# Patient Record
Sex: Female | Born: 1980 | Race: Black or African American | Hispanic: No | Marital: Single | Smoking: Current every day smoker
Health system: Southern US, Community
[De-identification: ages and names within clinical notes are randomized; demographics above are authoritative.]

## PROBLEM LIST (undated history)

## (undated) DIAGNOSIS — R011 Cardiac murmur, unspecified: Secondary | ICD-10-CM

## (undated) HISTORY — DX: Cardiac murmur, unspecified: R01.1

---

## 2005-01-07 ENCOUNTER — Emergency Department: Payer: Self-pay | Admitting: Emergency Medicine

## 2005-12-31 ENCOUNTER — Emergency Department: Payer: Self-pay | Admitting: Emergency Medicine

## 2005-12-31 ENCOUNTER — Other Ambulatory Visit: Payer: Self-pay

## 2006-01-02 ENCOUNTER — Ambulatory Visit: Payer: Self-pay | Admitting: Emergency Medicine

## 2006-03-23 ENCOUNTER — Emergency Department: Payer: Self-pay | Admitting: Emergency Medicine

## 2006-05-14 ENCOUNTER — Emergency Department: Payer: Self-pay | Admitting: Emergency Medicine

## 2007-06-28 ENCOUNTER — Emergency Department: Payer: Self-pay | Admitting: Emergency Medicine

## 2007-07-12 ENCOUNTER — Emergency Department: Payer: Self-pay | Admitting: Emergency Medicine

## 2009-04-19 ENCOUNTER — Emergency Department: Payer: Self-pay | Admitting: Emergency Medicine

## 2009-05-27 ENCOUNTER — Emergency Department: Payer: Self-pay | Admitting: Emergency Medicine

## 2009-12-21 ENCOUNTER — Emergency Department: Payer: Self-pay | Admitting: Emergency Medicine

## 2012-03-04 ENCOUNTER — Emergency Department: Payer: Self-pay

## 2012-03-04 LAB — URINALYSIS, COMPLETE
Ketone: NEGATIVE
Nitrite: NEGATIVE
Protein: NEGATIVE
Specific Gravity: 1.009 (ref 1.003–1.030)
WBC UR: 244 /HPF (ref 0–5)

## 2013-01-01 ENCOUNTER — Emergency Department: Payer: Self-pay | Admitting: Emergency Medicine

## 2014-08-30 ENCOUNTER — Emergency Department: Payer: Self-pay | Admitting: Emergency Medicine

## 2015-03-14 DIAGNOSIS — Z3A01 Less than 8 weeks gestation of pregnancy: Secondary | ICD-10-CM | POA: Diagnosis not present

## 2015-03-14 DIAGNOSIS — O209 Hemorrhage in early pregnancy, unspecified: Secondary | ICD-10-CM | POA: Diagnosis present

## 2015-03-14 DIAGNOSIS — O200 Threatened abortion: Secondary | ICD-10-CM | POA: Insufficient documentation

## 2015-03-14 LAB — URINALYSIS COMPLETE WITH MICROSCOPIC (ARMC ONLY)
BACTERIA UA: NONE SEEN
Bilirubin Urine: NEGATIVE
Glucose, UA: NEGATIVE mg/dL
Ketones, ur: NEGATIVE mg/dL
LEUKOCYTES UA: NEGATIVE
Nitrite: NEGATIVE
PROTEIN: NEGATIVE mg/dL
Specific Gravity, Urine: 1.023 (ref 1.005–1.030)
pH: 6 (ref 5.0–8.0)

## 2015-03-14 LAB — CBC
HEMATOCRIT: 33.5 % — AB (ref 35.0–47.0)
Hemoglobin: 11.2 g/dL — ABNORMAL LOW (ref 12.0–16.0)
MCH: 33.9 pg (ref 26.0–34.0)
MCHC: 33.4 g/dL (ref 32.0–36.0)
MCV: 101.4 fL — AB (ref 80.0–100.0)
Platelets: 185 10*3/uL (ref 150–440)
RBC: 3.3 MIL/uL — ABNORMAL LOW (ref 3.80–5.20)
RDW: 13.1 % (ref 11.5–14.5)
WBC: 5.4 10*3/uL (ref 3.6–11.0)

## 2015-03-14 LAB — HCG, QUANTITATIVE, PREGNANCY: HCG, BETA CHAIN, QUANT, S: 1186 m[IU]/mL — AB (ref <5)

## 2015-03-14 NOTE — ED Notes (Signed)
Vaginal bleeding since today with abd cramping, pt is [redacted] weeks pregnant.

## 2015-03-15 ENCOUNTER — Emergency Department
Admission: EM | Admit: 2015-03-15 | Discharge: 2015-03-15 | Disposition: A | Discharge status: Home / Self Care | Payer: Medicaid Other | Attending: Emergency Medicine | Admitting: Emergency Medicine

## 2015-03-15 ENCOUNTER — Emergency Department: Payer: Medicaid Other

## 2015-03-15 DIAGNOSIS — O039 Complete or unspecified spontaneous abortion without complication: Secondary | ICD-10-CM

## 2015-03-15 DIAGNOSIS — O200 Threatened abortion: Secondary | ICD-10-CM

## 2015-03-15 LAB — BASIC METABOLIC PANEL
Anion gap: 6 (ref 5–15)
BUN: 11 mg/dL (ref 6–20)
CALCIUM: 8.9 mg/dL (ref 8.9–10.3)
CO2: 24 mmol/L (ref 22–32)
CREATININE: 0.73 mg/dL (ref 0.44–1.00)
Chloride: 107 mmol/L (ref 101–111)
GFR calc Af Amer: >60 mL/min (ref >60)
GFR calc non Af Amer: >60 mL/min (ref >60)
GLUCOSE: 90 mg/dL (ref 65–99)
Potassium: 3.6 mmol/L (ref 3.5–5.1)
Sodium: 137 mmol/L (ref 135–145)

## 2015-03-15 LAB — ABO/RH: ABO/RH(D): O POS

## 2015-03-15 NOTE — ED Notes (Signed)
Patient transported to Ultrasound 

## 2015-03-15 NOTE — Discharge Instructions (Signed)
Incomplete Miscarriage A miscarriage is the sudden loss of an unborn baby (fetus) before the 20th week of pregnancy. In an incomplete miscarriage, parts of the fetus or placenta (afterbirth) remain in the body.  Having a miscarriage can be an emotional experience. Talk with your health care provider about any questions you may have about miscarrying, the grieving process, and your future pregnancy plans. CAUSES   Problems with the fetal chromosomes that make it impossible for the baby to develop normally. Problems with the baby's genes or chromosomes are most often the result of errors that occur by chance as the embryo divides and grows. The problems are not inherited from the parents.  Infection of the cervix or uterus.  Hormone problems.  Problems with the cervix, such as having an incompetent cervix. This is when the tissue in the cervix is not strong enough to hold the pregnancy.  Problems with the uterus, such as an abnormally shaped uterus, uterine fibroids, or congenital abnormalities.  Certain medical conditions.  Smoking, drinking alcohol, or taking illegal drugs.  Trauma. SYMPTOMS   Vaginal bleeding or spotting, with or without cramps or pain.  Pain or cramping in the abdomen or lower back.  Passing fluid, tissue, or blood clots from the vagina. DIAGNOSIS  Your health care provider will perform a physical exam. You may also have an ultrasound to confirm the miscarriage. Blood or urine tests may also be ordered. TREATMENT   Usually, a dilation and curettage (D&C) procedure is performed. During a D&C procedure, the cervix is widened (dilated) and any remaining fetal or placental tissue is gently removed from the uterus.  Antibiotic medicines are prescribed if there is an infection. Other medicines may be given to reduce the size of the uterus (contract) if there is a lot of bleeding.  If you have Rh negative blood and your baby was Rh positive, you will need a Rho (D)  immune globulin shot. This shot will protect any future baby from having Rh blood problems in future pregnancies.  You may be confined to bed rest. This means you should stay in bed and only get up to use the bathroom. HOME CARE INSTRUCTIONS   Rest as directed by your health care provider.  Restrict activity as directed by your health care provider. You may be allowed to continue light activity if curettage was not done but you require further treatment.  Keep track of the number of pads you use each day. Keep track of how soaked (saturated) they are. Record this information.  Do not  use tampons.  Do not douche or have sexual intercourse until approved by your health care provider.  Keep all follow-up appointments for reevaluation and continuing management.  Only take over-the-counter or prescription medicines for pain, fever, or discomfort as directed by your health care provider.  Take antibiotic medicine as directed by your health care provider. Make sure you finish it even if you start to feel better. SEEK IMMEDIATE MEDICAL CARE IF:   You experience severe cramps in your stomach, back, or abdomen.  You have an unexplained temperature (make sure to record these temperatures).  You pass large clots or tissue (save these for your health care provider to inspect).  Your bleeding increases.  You become light-headed, weak, or have fainting episodes. MAKE SURE YOU:   Understand these instructions.  Will watch your condition.  Will get help right away if you are not doing well or get worse. Document Released: 06/10/2005 Document Revised: 10/25/2013 Document Reviewed:   01/07/2013 Exit[employer] Patient Information 2015 [employer]. This information is not intended to replace advice given to you by your health care provider. Make sure you discuss any questions you have with your health care provider.  

## 2015-03-15 NOTE — ED Notes (Signed)
Pt is sleeping at this time.

## 2015-03-15 NOTE — ED Provider Notes (Signed)
[facility] Emergency Department Provider Note  ____________________________________________  Time seen: 2:20 AM  I have reviewed the triage vital signs and the nursing notes.   HISTORY  Chief Complaint Vaginal Bleeding     HPI Denise Decker is a 34 y.o. female G4 P2 (1 elective abortion. )presents with pelvic pain and vaginal bleeding since last night. Patient stated bleeding initially was just spotting but it progressed to heaviness of a menses patient states pain is predominantly on the right lower pelvis. Patient denies any dysuria no nausea or vomiting or fever.     Past medical history None There are no active problems to display for this patient.   Past surgical history None No current outpatient prescriptions on file.  Allergies No known drug allergies No family history on file.  Social History Social History  Substance Use Topics  . Smoking status: Not on file  . Smokeless tobacco: Not on file  . Alcohol Use: Not on file    Review of Systems  Constitutional: Negative for fever. Eyes: Negative for visual changes. ENT: Negative for sore throat. Cardiovascular: Negative for chest pain. Respiratory: Negative for shortness of breath. Gastrointestinal: Negative for abdominal pain, vomiting and diarrhea. Genitourinary: Positive for pelvic pain and vaginal bleeding Musculoskeletal: Negative for back pain. Skin: Negative for rash. Neurological: Negative for headaches, focal weakness or numbness.     10-point ROS otherwise negative.  ____________________________________________   PHYSICAL EXAM:  VITAL SIGNS: ED Triage Vitals  Enc Vitals Group     BP 03/14/15 2238 126/70 mmHg     Pulse Rate 03/14/15 2238 92     Resp 03/14/15 2238 20     Temp 03/14/15 2238 98.6 F (37 C)     Temp Source 03/14/15 2238 Oral     SpO2 03/14/15 2238 100 %     Weight 03/14/15 2238 142 lb (64.411 kg)     Height 03/14/15 2238  (1.575 m)      Head Cir --      Peak Flow --      Pain Score 03/14/15 2238 7     Pain Loc --      Pain Edu? --      Excl. in GC? --      Constitutional: Alert and oriented. Well appearing and in no distress. Eyes: Conjunctivae are normal. PERRL. Normal extraocular movements. ENT   Head: Normocephalic and atraumatic.   Nose: No congestion/rhinnorhea.   Mouth/Throat: Mucous membranes are moist.   Neck: No stridor. Cardiovascular: Normal rate, regular rhythm. Normal and symmetric distal pulses are present in all extremities. No murmurs, rubs, or gallops. Respiratory: Normal respiratory effort without tachypnea nor retractions. Breath sounds are clear and equal bilaterally. No wheezes/rales/rhonchi. Gastrointestinal: Suprapubic/right pelvic pain with palpation. No distention. There is no CVA tendernesS Musculoskeletal: Nontender with normal range of motion in all extremities. No joint effusions.  No lower extremity tenderness nor edema. Neurologic:  Normal speech and language. No gross focal neurologic deficits are appreciated. Speech is normal.  Skin:  Skin is warm, dry and intact. No rash noted. Psychiatric: Mood and affect are normal. Speech and behavior are normal. Patient exhibits appropriate insight and judgment.  ____________________________________________    LABS (pertinent positives/negatives)  Labs Reviewed  CBC - Abnormal; Notable for the following:    RBC 3.30 (*)    Hemoglobin 11.2 (*)    HCT 33.5 (*)    MCV 101.4 (*)    All other components within normal limits  URINALYSIS COMPLETEWITH MICROSCOPIC ([facility] ONLY) - Abnormal; Notable for the following:    Color, Urine YELLOW (*)    APPearance CLEAR (*)    Hgb urine dipstick 3+ (*)    Squamous Epithelial / LPF 0-5 (*)    All other components within normal limits  HCG, QUANTITATIVE, PREGNANCY - Abnormal; Notable for the following:    hCG, Beta Chain, Quant, S 1186 (*)    All other components within normal limits   BASIC METABOLIC PANEL  ABO/RH       RADIOLOGY  US OB Comp Less 14 Wks (Final result) Result time: 03/15/15 03:57:48   Final result by Rad Results In Interface (03/15/15 03:57:48)   Narrative:   CLINICAL DATA: Pelvic pain and vaginal bleeding for 1 day. Gestational age by last menstrual period 8 weeks and 1 day. G4 P2. Beta HCG 1,186.  EXAM: OBSTETRIC <14 WK [place] AND TRANSVAGINAL OB US  TECHNIQUE: Both transabdominal and transvaginal ultrasound examinations were performed for complete evaluation of the gestation as well as the maternal uterus, adnexal regions, and pelvic cul-de-sac. Transvaginal technique was performed to assess early pregnancy.  COMPARISON: None.  FINDINGS: Intrauterine gestational sac: Not present  Yolk sac: Not present  Embryo: Not present  Cardiac Activity: Not present  Maternal uterus/adnexae: Echogenic avascular thickening of the endometrium to 1.8 cm. No discrete gestational sac. 1.9 x 1.9 x 1.5 cm hypoechoic mildly shadowing posterior uterine intramural leiomyoma. Normal appearance of the adnexae. No free fluid.  IMPRESSION: No sonographic findings of IUP or, ectopic pregnancy.  Echogenic debris within the endometrium most consistent with blood products, less likely retained products of conception though, recommend serial HCG to verify declination.  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   Electronically Signed        INITIAL IMPRESSION / ASSESSMENT AND PLAN / ED COURSE  Pertinent labs & imaging results that were available during my care of the patient were reviewed by me and considered in my medical decision making (see chart for details).  Ultrasound findings consistent with possible incomplete miscarriage as such patient advised to follow-up with Dr. Dalbert Garnet GYN today. Patient's blood type O positive.  ____________________________________________   FINAL CLINICAL IMPRESSION(S) / ED DIAGNOSES  Final diagnoses:  Miscarriage,  threatened, early pregnancy  Miscarriage      Darci Current, MD 03/15/15 [phone]

## 2017-02-16 ENCOUNTER — Encounter: Payer: Self-pay | Admitting: Emergency Medicine

## 2017-02-16 ENCOUNTER — Emergency Department
Admission: EM | Admit: 2017-02-16 | Discharge: 2017-02-17 | Disposition: A | Discharge status: Home / Self Care | Payer: Medicaid Other | Attending: Emergency Medicine | Admitting: Emergency Medicine

## 2017-02-16 DIAGNOSIS — N3000 Acute cystitis without hematuria: Secondary | ICD-10-CM | POA: Diagnosis not present

## 2017-02-16 DIAGNOSIS — F17200 Nicotine dependence, unspecified, uncomplicated: Secondary | ICD-10-CM | POA: Insufficient documentation

## 2017-02-16 DIAGNOSIS — R1110 Vomiting, unspecified: Secondary | ICD-10-CM | POA: Diagnosis present

## 2017-02-16 DIAGNOSIS — R112 Nausea with vomiting, unspecified: Secondary | ICD-10-CM | POA: Diagnosis not present

## 2017-02-16 LAB — URINALYSIS, COMPLETE (UACMP) WITH MICROSCOPIC
BILIRUBIN URINE: NEGATIVE
GLUCOSE, UA: NEGATIVE mg/dL
HGB URINE DIPSTICK: NEGATIVE
Ketones, ur: 20 mg/dL — AB
LEUKOCYTES UA: NEGATIVE
NITRITE: POSITIVE — AB
PROTEIN: NEGATIVE mg/dL
Specific Gravity, Urine: 1.01 (ref 1.005–1.030)
pH: 6 (ref 5.0–8.0)

## 2017-02-16 LAB — CBC
HEMATOCRIT: 35.8 % (ref 35.0–47.0)
HEMOGLOBIN: 12.5 g/dL (ref 12.0–16.0)
MCH: 34.8 pg — ABNORMAL HIGH (ref 26.0–34.0)
MCHC: 34.9 g/dL (ref 32.0–36.0)
MCV: 99.7 fL (ref 80.0–100.0)
Platelets: 189 10*3/uL (ref 150–440)
RBC: 3.59 MIL/uL — AB (ref 3.80–5.20)
RDW: 12.9 % (ref 11.5–14.5)
WBC: 10.2 10*3/uL (ref 3.6–11.0)

## 2017-02-16 LAB — COMPREHENSIVE METABOLIC PANEL
ALBUMIN: 4.1 g/dL (ref 3.5–5.0)
ALK PHOS: 39 U/L (ref 38–126)
ALT: 12 U/L — ABNORMAL LOW (ref 14–54)
ANION GAP: 9 (ref 5–15)
AST: 22 U/L (ref 15–41)
BILIRUBIN TOTAL: 0.9 mg/dL (ref 0.3–1.2)
BUN: 14 mg/dL (ref 6–20)
CALCIUM: 9.4 mg/dL (ref 8.9–10.3)
CO2: 20 mmol/L — ABNORMAL LOW (ref 22–32)
Chloride: 110 mmol/L (ref 101–111)
Creatinine, Ser: 1.26 mg/dL — ABNORMAL HIGH (ref 0.44–1.00)
GFR calc non Af Amer: 55 mL/min — ABNORMAL LOW (ref >60)
GLUCOSE: 101 mg/dL — AB (ref 65–99)
POTASSIUM: 3.6 mmol/L (ref 3.5–5.1)
SODIUM: 139 mmol/L (ref 135–145)
TOTAL PROTEIN: 8.2 g/dL — AB (ref 6.5–8.1)

## 2017-02-16 LAB — LIPASE, BLOOD: LIPASE: 24 U/L (ref 11–51)

## 2017-02-16 LAB — POCT PREGNANCY, URINE: Preg Test, Ur: NEGATIVE

## 2017-02-16 MED ORDER — ONDANSETRON HCL 4 MG PO TABS: 4.0000 mg | ORAL_TABLET | Freq: Three times a day (TID) | ORAL | 0 refills | Status: DC | PRN

## 2017-02-16 MED ORDER — SODIUM CHLORIDE 0.9 % IV BOLUS (SEPSIS)
1000.0000 mL | Freq: Once | INTRAVENOUS | Status: AC
Start: 2017-02-16 — End: 2017-02-17
  Administered 2017-02-16: 1000 mL via INTRAVENOUS

## 2017-02-16 MED ORDER — ONDANSETRON HCL 4 MG/2ML IJ SOLN
4.0000 mg | Freq: Once | INTRAMUSCULAR | Status: AC
  Administered 2017-02-16: 4 mg via INTRAVENOUS
  Filled 2017-02-16: qty 2

## 2017-02-16 MED ORDER — ONDANSETRON 4 MG PO TBDP
4.0000 mg | ORAL_TABLET | Freq: Once | ORAL | Status: AC | PRN
  Administered 2017-02-16: 4 mg via ORAL

## 2017-02-16 MED ORDER — ONDANSETRON 4 MG PO TBDP
ORAL_TABLET | ORAL | Status: AC
  Filled 2017-02-16: qty 1

## 2017-02-16 NOTE — ED Provider Notes (Signed)
[facility] Emergency Department Provider Note  ____________________________________________  Time seen: Approximately 9:56 PM  I have reviewed the triage vital signs and the nursing notes.   HISTORY  Chief Complaint Emesis   HPI Denise Decker is a 36 y.o. female no significant past medical history who presents for evaluation of vomiting. Patient reports that she was in her usual state of health until this afternoon. She had [employer] for lunch and went down for a nap. When she woke up she was feeling nauseated. She reports several episodes of nonbloody nonbilious emesis and chills. No fever, no diarrhea, no abdominal pain. Her last bowel movement was 3 days ago which is normal for her. No abdominal distention, no prior history of SBO. She is passing flatus. She is feeling dehydrated at this time.She is also complaining of dysuria that has been ongoing for 3 days but no flank pain.  History reviewed. No pertinent past medical history.  There are no active problems to display for this patient.   History reviewed. No pertinent surgical history.  Prior to Admission medications   Medication Sig Start Date End Date Taking? Authorizing Provider  ondansetron (ZOFRAN) 4 MG tablet Take 1 tablet (4 mg total) by mouth every 8 (eight) hours as needed for nausea or vomiting. 02/16/17   Nita Sickle, MD    Allergies Patient has no known allergies.  History reviewed. No pertinent family history.  Social History Social History  Substance Use Topics  . Smoking status: Current Every Day Smoker  . Smokeless tobacco: Never Used  . Alcohol use No    Review of Systems  Constitutional: Negative for fever. + chills Eyes: Negative for visual changes. ENT: Negative for sore throat. Neck: No neck pain  Cardiovascular: Negative for chest pain. Respiratory: Negative for shortness of breath. Gastrointestinal: Negative for abdominal pain,  Diarrhea. +  vomiting Genitourinary: Negative for dysuria. Musculoskeletal: Negative for back pain. Skin: Negative for rash. Neurological: Negative for headaches, weakness or numbness. Psych: No SI or HI  ____________________________________________   PHYSICAL EXAM:  VITAL SIGNS: ED Triage Vitals  Enc Vitals Group     BP 02/16/17 2050 102/63     Pulse Rate 02/16/17 2050 76     Resp 02/16/17 2050 16     Temp 02/16/17 2050 97.8 F (36.6 C)     Temp Source 02/16/17 2050 Oral     SpO2 02/16/17 2050 98 %     Weight 02/16/17 2051 140 lb (63.5 kg)     Height 02/16/17 2051 5\' 2"  (1.575 m)     Head Circumference --      Peak Flow --      Pain Score --      Pain Loc --      Pain Edu? --      Excl. in GC? --     Constitutional: Alert and oriented. Well appearing and in no apparent distress. HEENT:      Head: Normocephalic and atraumatic.         Eyes: Conjunctivae are normal. Sclera is non-icteric.       Mouth/Throat: Mucous membranes are moist.       Neck: Supple with no signs of meningismus. Cardiovascular: Regular rate and rhythm. No murmurs, gallops, or rubs. 2+ symmetrical distal pulses are present in all extremities. No JVD. Respiratory: Normal respiratory effort. Lungs are clear to auscultation bilaterally. No wheezes, crackles, or rhonchi.  Gastrointestinal: Soft, non tender, and non distended with positive bowel sounds. No  rebound or guarding. Genitourinary: No CVA tenderness. Musculoskeletal: Nontender with normal range of motion in all extremities. No edema, cyanosis, or erythema of extremities. Neurologic: Normal speech and language. Face is symmetric. Moving all extremities. No gross focal neurologic deficits are appreciated. Skin: Skin is warm, dry and intact. No rash noted. Psychiatric: Mood and affect are normal. Speech and behavior are normal.  ____________________________________________   LABS (all labs ordered are listed, but only abnormal results are displayed)  Labs  Reviewed  COMPREHENSIVE METABOLIC PANEL - Abnormal; Notable for the following:       Result Value   CO2 20 (*)    Glucose, Bld 101 (*)    Creatinine, Ser 1.26 (*)    Total Protein 8.2 (*)    ALT 12 (*)    GFR calc non Af Amer 55 (*)    All other components within normal limits  CBC - Abnormal; Notable for the following:    RBC 3.59 (*)    MCH 34.8 (*)    All other components within normal limits  LIPASE, BLOOD  URINALYSIS, COMPLETE (UACMP) WITH MICROSCOPIC  POC URINE PREG, ED   ____________________________________________  EKG  none  ____________________________________________  RADIOLOGY  none  ____________________________________________   PROCEDURES  Procedure(s) performed: None Procedures Critical Care performed:  None ____________________________________________   INITIAL IMPRESSION / ASSESSMENT AND PLAN / ED COURSE  36 y.o. female no significant past medical history who presents for evaluation of several episodes of nonbloody nonbilious emesis since this afternoon associated with chills. Patient is extremely well appearing, in no distress, has normal vital signs, her abdomen is soft with no tenderness throughout, no distention, normal bowel sounds. No evidence clinically of SBO. Labs at mildly increased creatinine of 1.26 and bicarbonate of 20. Normal anion gap. Normal white count, stable hemoglobin. Normal lipase. Pregnancy test is pending. The patient concerned for food poisoning versus viral gastroenteritis versus UTI/ pyelo. UA pending to eval dysuria. We'll give IV fluids, Zofran, and reassess.  Clinical Course as of Feb 16 2318  Sun Feb 16, 2017  2317 UA and Upreg pending. Care transferred to Dr. Lamont Snowball  [CV]    Clinical Course User Index [CV] Don Perking [place], MD    Pertinent labs & imaging results that were available during my care of the patient were reviewed by me and considered in my medical decision making (see chart for  details).    ____________________________________________   FINAL CLINICAL IMPRESSION(S) / ED DIAGNOSES  Final diagnoses:  Non-intractable vomiting with nausea, unspecified vomiting type      NEW MEDICATIONS STARTED DURING THIS VISIT:  New Prescriptions   ONDANSETRON (ZOFRAN) 4 MG TABLET    Take 1 tablet (4 mg total) by mouth every 8 (eight) hours as needed for nausea or vomiting.     Note:  This document was prepared using Dragon voice recognition software and may include unintentional dictation errors.    Don Perking, [place], MD 02/16/17 [phone]

## 2017-02-16 NOTE — ED Notes (Signed)
Urged patient to void. 

## 2017-02-16 NOTE — ED Triage Notes (Signed)
Pt states began vomiting at 1400 today. Pt states has had chills, no diarrhea. Pt denies pain. Skin normal color warm and dry.

## 2017-02-16 NOTE — Discharge Instructions (Addendum)
Please take all of your antibiotics as prescribed and follow up with her primary care physician in 2 days for recheck. Return to the emergency department sooner for any concerns.  It was a pleasure to take care of you today, and thank you for coming to our emergency department.  If you have any questions or concerns before leaving please ask the nurse to grab me and I'm more than happy to go through your aftercare instructions again.  If you were prescribed any opioid pain medication today such as Norco, Vicodin, Percocet, morphine, hydrocodone, or oxycodone please make sure you do not drive when you are taking this medication as it can alter your ability to drive safely.  If you have any concerns once you are home that you are not improving or are in fact getting worse before you can make it to your follow-up appointment, please do not hesitate to call 911 and come back for further evaluation.  Merrily Brittle, MD  Results for orders placed or performed during the hospital encounter of 02/16/17  Comprehensive metabolic panel  Result Value Ref Range   Sodium 139 135 - 145 mmol/L   Potassium 3.6 3.5 - 5.1 mmol/L   Chloride 110 101 - 111 mmol/L   CO2 20 (L) 22 - 32 mmol/L   Glucose, Bld 101 (H) 65 - 99 mg/dL   BUN 14 6 - 20 mg/dL   Creatinine, Ser 1.61 (H) 0.44 - 1.00 mg/dL   Calcium 9.4 8.9 - 09.6 mg/dL   Total Protein 8.2 (H) 6.5 - 8.1 g/dL   Albumin 4.1 3.5 - 5.0 g/dL   AST 22 15 - 41 U/L   ALT 12 (L) 14 - 54 U/L   Alkaline Phosphatase 39 38 - 126 U/L   Total Bilirubin 0.9 0.3 - 1.2 mg/dL   GFR calc non Af Amer 55 (L) >60 mL/min   GFR calc Af Amer >60 >60 mL/min   Anion gap 9 5 - 15  CBC  Result Value Ref Range   WBC 10.2 3.6 - 11.0 K/uL   RBC 3.59 (L) 3.80 - 5.20 MIL/uL   Hemoglobin 12.5 12.0 - 16.0 g/dL   HCT 04.5 40.9 - 81.1 %   MCV 99.7 80.0 - 100.0 fL   MCH 34.8 (H) 26.0 - 34.0 pg   MCHC 34.9 32.0 - 36.0 g/dL   RDW 91.4 78.2 - 95.6 %   Platelets 189 150 - 440 K/uL    Urinalysis, Complete w Microscopic  Result Value Ref Range   Color, Urine AMBER (A) YELLOW   APPearance CLEAR (A) CLEAR   Specific Gravity, Urine 1.010 1.005 - 1.030   pH 6.0 5.0 - 8.0   Glucose, UA NEGATIVE NEGATIVE mg/dL   Hgb urine dipstick NEGATIVE NEGATIVE   Bilirubin Urine NEGATIVE NEGATIVE   Ketones, ur 20 (A) NEGATIVE mg/dL   Protein, ur NEGATIVE NEGATIVE mg/dL   Nitrite POSITIVE (A) NEGATIVE   Leukocytes, UA NEGATIVE NEGATIVE   RBC / HPF 0-5 0 - 5 RBC/hpf   WBC, UA 6-30 0 - 5 WBC/hpf   Bacteria, UA RARE (A) NONE SEEN   Squamous Epithelial / LPF 0-5 (A) NONE SEEN   Mucus PRESENT   Lipase, blood  Result Value Ref Range   Lipase 24 11 - 51 U/L  hCG, quantitative, pregnancy  Result Value Ref Range   hCG, Beta Chain, Quant, S 1 <5 mIU/mL  Pregnancy, urine POC  Result Value Ref Range   Preg Test, Ur NEGATIVE  NEGATIVE

## 2017-02-17 LAB — HCG, QUANTITATIVE, PREGNANCY: HCG, BETA CHAIN, QUANT, S: 1 m[IU]/mL (ref <5)

## 2017-02-17 MED ORDER — CEPHALEXIN 500 MG PO CAPS: 500.0000 mg | ORAL_CAPSULE | Freq: Four times a day (QID) | ORAL | 0 refills | Status: AC

## 2017-02-17 MED ORDER — CEPHALEXIN 500 MG PO CAPS
500.0000 mg | ORAL_CAPSULE | Freq: Once | ORAL | Status: AC
  Administered 2017-02-17: 500 mg via ORAL
  Filled 2017-02-17: qty 1

## 2017-02-17 NOTE — ED Notes (Signed)
Reviewed d/c instructions, follow-up care, prescriptions with patient. Pt verbalized understanding.  

## 2017-02-18 ENCOUNTER — Encounter: Payer: Self-pay | Admitting: Emergency Medicine

## 2017-02-18 ENCOUNTER — Emergency Department
Admission: EM | Admit: 2017-02-18 | Discharge: 2017-02-18 | Disposition: A | Discharge status: Home / Self Care | Payer: Medicaid Other | Attending: Emergency Medicine | Admitting: Emergency Medicine

## 2017-02-18 DIAGNOSIS — R110 Nausea: Secondary | ICD-10-CM | POA: Diagnosis not present

## 2017-02-18 DIAGNOSIS — E860 Dehydration: Secondary | ICD-10-CM | POA: Diagnosis not present

## 2017-02-18 DIAGNOSIS — F17200 Nicotine dependence, unspecified, uncomplicated: Secondary | ICD-10-CM | POA: Insufficient documentation

## 2017-02-18 DIAGNOSIS — N289 Disorder of kidney and ureter, unspecified: Secondary | ICD-10-CM | POA: Insufficient documentation

## 2017-02-18 DIAGNOSIS — N3000 Acute cystitis without hematuria: Secondary | ICD-10-CM

## 2017-02-18 DIAGNOSIS — R531 Weakness: Secondary | ICD-10-CM | POA: Diagnosis present

## 2017-02-18 LAB — CBC
HEMATOCRIT: 32.3 % — AB (ref 35.0–47.0)
HEMOGLOBIN: 10.9 g/dL — AB (ref 12.0–16.0)
MCH: 34 pg (ref 26.0–34.0)
MCHC: 33.8 g/dL (ref 32.0–36.0)
MCV: 100.5 fL — ABNORMAL HIGH (ref 80.0–100.0)
Platelets: 172 10*3/uL (ref 150–440)
RBC: 3.21 MIL/uL — ABNORMAL LOW (ref 3.80–5.20)
RDW: 13.1 % (ref 11.5–14.5)
WBC: 5.7 10*3/uL (ref 3.6–11.0)

## 2017-02-18 LAB — BASIC METABOLIC PANEL
ANION GAP: 5 (ref 5–15)
BUN: 11 mg/dL (ref 6–20)
CALCIUM: 8.8 mg/dL — AB (ref 8.9–10.3)
CO2: 23 mmol/L (ref 22–32)
Chloride: 111 mmol/L (ref 101–111)
Creatinine, Ser: 1.22 mg/dL — ABNORMAL HIGH (ref 0.44–1.00)
GFR calc non Af Amer: 57 mL/min — ABNORMAL LOW (ref >60)
Glucose, Bld: 106 mg/dL — ABNORMAL HIGH (ref 65–99)
POTASSIUM: 3.7 mmol/L (ref 3.5–5.1)
Sodium: 139 mmol/L (ref 135–145)

## 2017-02-18 LAB — URINALYSIS, COMPLETE (UACMP) WITH MICROSCOPIC
BILIRUBIN URINE: NEGATIVE
Glucose, UA: NEGATIVE mg/dL
HGB URINE DIPSTICK: NEGATIVE
Ketones, ur: 5 mg/dL — AB
Leukocytes, UA: NEGATIVE
NITRITE: NEGATIVE
PROTEIN: NEGATIVE mg/dL
Specific Gravity, Urine: 1.011 (ref 1.005–1.030)
pH: 6 (ref 5.0–8.0)

## 2017-02-18 LAB — POCT PREGNANCY, URINE: PREG TEST UR: NEGATIVE

## 2017-02-18 MED ORDER — ONDANSETRON HCL 4 MG/2ML IJ SOLN
4.0000 mg | Freq: Once | INTRAMUSCULAR | Status: AC
  Administered 2017-02-18: 4 mg via INTRAVENOUS
  Filled 2017-02-18: qty 2

## 2017-02-18 MED ORDER — ONDANSETRON 4 MG PO TBDP: 4.0000 mg | ORAL_TABLET | Freq: Three times a day (TID) | ORAL | 0 refills | Status: DC | PRN

## 2017-02-18 MED ORDER — CEFTRIAXONE SODIUM 1 G IJ SOLR
1.0000 g | Freq: Once | INTRAMUSCULAR | Status: AC
  Administered 2017-02-18: 1 g via INTRAVENOUS
  Filled 2017-02-18: qty 10

## 2017-02-18 MED ORDER — SODIUM CHLORIDE 0.9 % IV BOLUS (SEPSIS)
1000.0000 mL | Freq: Once | INTRAVENOUS | Status: AC
  Administered 2017-02-18: 1000 mL via INTRAVENOUS

## 2017-02-18 NOTE — ED Notes (Signed)
Denise Decker aware of placement in Room 18.

## 2017-02-18 NOTE — ED Triage Notes (Signed)
Pt seen here Sunday and reports was dehydrated.  Reports it was affecting her kidneys.  Still feels the same but has not had vomiting. Is on abx for UTI.  Ambulatory to triage without difficulty. VSS.  Respirations unlabored. Has some dysuria.

## 2017-02-18 NOTE — ED Provider Notes (Signed)
[facility] Emergency Department Provider Note  ____________________________________________  Time seen: Approximately 11:23 AM  I have reviewed the triage vital signs and the nursing notes.   HISTORY  Chief Complaint Dehydration    HPI Denise Decker is a 36 y.o. female , otherwise healthy, with a recent diagnosis of UTI presenting for general malaiseand concern for dehydration. The patient was seen here 8/26 for nausea and vomiting, which resolved in the emergency department. She was discharged home with cephalexin for urinary tract infection. The patient reports that after leaving the emergency department, she immediately went to work where she works in a hot environment and feels ke she sweated a lot. Yesterday, she was only able to take 2 doses of her Keflex due to general malaise.today, the patient has nausea without vomiting. She denies any abdominal pain, flank pain, fever or chills, abdominal distention, constipation or diarrhea. No hematuria.  History reviewed. No pertinent past medical history.  There are no active problems to display for this patient.   History reviewed. No pertinent surgical history.  Current Outpatient Rx  . Order #: [id number] Class: Print  . Order #: [id number] Class: Print  . Order #: [id number] Class: Print    Allergies Patient has no known allergies.  History reviewed. No pertinent family history.  Social History Social History  Substance Use Topics  . Smoking status: Current Every Day Smoker  . Smokeless tobacco: Never Used  . Alcohol use No    Review of Systems Constitutional: No fever/chills.no lightheadedness or syncope. Positive generalized malaise. Eyes: No visual changes. ENT: No sore throat. No congestion or rhinorrhea. Cardiovascular: Denies chest pain. Denies palpitations. Respiratory: Denies shortness of breath.  No cough. Gastrointestinal: positive anorexia. No flank pain.No abdominal pain.   Positive nausea. Vomiting has resolved.  No diarrhea.  No constipation. Genitourinary: Negative for dysuria.no urinary frequency. No hematuria.no change in vaginal discharge. Musculoskeletal: Negative for back pain.no flank pain. Skin: Negative for rash. Neurological: Negative for headaches. No focal numbness, tingling or weakness.     ____________________________________________   PHYSICAL EXAM:  VITAL SIGNS: ED Triage Vitals  Enc Vitals Group     BP 02/18/17 1005 112/66     Pulse Rate 02/18/17 1004 100     Resp 02/18/17 1004 16     Temp 02/18/17 1004 98.7 F (37.1 C)     Temp Source 02/18/17 1004 Oral     SpO2 02/18/17 1004 100 %     Weight 02/18/17 1004 140 lb (63.5 kg)     Height 02/18/17 1004 5\' 2"  (1.575 m)     Head Circumference --      Peak Flow --      Pain Score 02/18/17 1058 3     Pain Loc --      Pain Edu? --      Excl. in GC? --     Constitutional: Alert and oriented. Well appearing and in no acute distress. Answers questions appropriately.the patient is sitting comfortably in the stretcher, smiling and interacting normally during my examination. Eyes: Conjunctivae are normal.  EOMI. No scleral icterus. Head: Atraumatic. Nose: No congestion/rhinnorhea. Mouth/Throat: Mucous membranes are dry.  Neck: No stridor.  Supple.  No JVD. No meningismus. Cardiovascular: Normal rate, regular rhythm. No murmurs, rubs or gallops.  Respiratory: Normal respiratory effort.  No accessory muscle use or retractions. Lungs CTAB.  No wheezes, rales or ronchi. Gastrointestinal: Soft, nontender and nondistended.  No guarding or rebound.  No peritoneal signs. No CVA tenderness to palpation. Genitourinary:  deferred as the patient has nogynecologic complaints. Musculoskeletal: No LE edema.  Neurologic:  A&Ox3.  Speech is clear.  Face and smile are symmetric.  EOMI.  Moves all extremities well. Skin:  Skin is warm, dry and intact. No rash noted. Psychiatric: Mood and affect are normal.  Speech and behavior are normal.  Normal judgement.  ____________________________________________   LABS (all labs ordered are listed, but only abnormal results are displayed)  Labs Reviewed  BASIC METABOLIC PANEL - Abnormal; Notable for the following:       Result Value   Glucose, Bld 106 (*)    Creatinine, Ser 1.22 (*)    Calcium 8.8 (*)    GFR calc non Af Amer 57 (*)    All other components within normal limits  CBC - Abnormal; Notable for the following:    RBC 3.21 (*)    Hemoglobin 10.9 (*)    HCT 32.3 (*)    MCV 100.5 (*)    All other components within normal limits  URINALYSIS, COMPLETE (UACMP) WITH MICROSCOPIC - Abnormal; Notable for the following:    Color, Urine YELLOW (*)    APPearance CLEAR (*)    Ketones, ur 5 (*)    Bacteria, UA RARE (*)    Squamous Epithelial / LPF 0-5 (*)    All other components within normal limits  URINE CULTURE  POCT PREGNANCY, URINE  POC URINE PREG, ED   ____________________________________________  EKG  Not indicated ____________________________________________  RADIOLOGY  No results found.  ____________________________________________   PROCEDURES  Procedure(s) performed: None  Procedures  Critical Care performed: No ____________________________________________   INITIAL IMPRESSION / ASSESSMENT AND PLAN / ED COURSE  Pertinent labs & imaging results that were available during my care of the patient were reviewed by me and considered in my medical decision making (see chart for details).  36 y.o. female with a recent diagnosis ofresenting with general malaise, nausea but no vomiting; overaproved but concern for dehydration. Overall, the patient is hemodynamically stable with reassuring vital signs; she is afebrile. She has no abnormal physical exam findings in the flank, abdomen or otherwise with the exception of mildly dry mucous membranes. The patient's urinalysis does show rare bacteria but her nitrites have become  negative. Her kidney function has improved from 1.26-1.22 today. She has a white blood cell count but is half what it was 2 days ago. I do believe her antibiotic treatment and clinical course shows improvement in the outpatient setting. At this time, the ptient is tolerating liquids without vomiting. We'll plan to give her intravenous fluids, a dose of Rocephin,Zofran for her nausea, and have her follow-up with a primary care physician in the next 1-2 days. Will discharge her home with Zofran that she is better able to tolerate her antibiotics. A urinary culture has been sent for her PCP to follow-up. We discussed return precautions as well as follow-up instructions.  ____________________________________________  FINAL CLINICAL IMPRESSION(S) / ED DIAGNOSES  Final diagnoses:  Acute cystitis without hematuria  Renal insufficiency         NEW MEDICATIONS STARTED DURING THIS VISIT:  New Prescriptions   ONDANSETRON (ZOFRAN ODT) 4 MG DISINTEGRATING TABLET    Take 1 tablet (4 mg total) by mouth every 8 (eight) hours as needed for nausea or vomiting.      Rockne Menghini, MD 02/18/17 1131

## 2017-02-18 NOTE — Discharge Instructions (Signed)
Please take a clear liquid diet for the next 24 hours, then advance to a bland diet as tolerated.  Continue to take your Cephalexin, starting tomorrow morning. You may take Zofran for nausea and vomiting.if you feel nauseated when taking your antibiotic, take Zofran 20 minutes prior to taking the antibiotic, and then take the antibiotic with a small amount of food.  Please make follow-up appointment with your primary care physician, and have them follow up the results of your urine culture.  Return to the emergency department if you develop severe pain, fever, inability to keep down fluids, blood in the urine, or any other symptoms concerning to you.

## 2017-02-20 LAB — URINE CULTURE: CULTURE: NO GROWTH

## 2017-09-17 IMAGING — US US OB TRANSVAGINAL
1 series · 14 of 28 positions shown · non-contrast
Comparison: None.

CLINICAL DATA: Pelvic pain and vaginal bleeding for 1 day.
Gestational age by last menstrual period 8 weeks and 1 day. G4 P2.
Beta HCG [DATE].

EXAM:
OBSTETRIC <14 WK US AND TRANSVAGINAL OB US
TECHNIQUE: Both transabdominal and transvaginal ultrasound examinations were
performed for complete evaluation of the gestation as well as the
maternal uterus, adnexal regions, and pelvic cul-de-sac.
Transvaginal technique was performed to assess early pregnancy.

[Series 1: us ob transvaginal · 0.20mm/px · 14 of 64 slices shown]
[im 3/64]
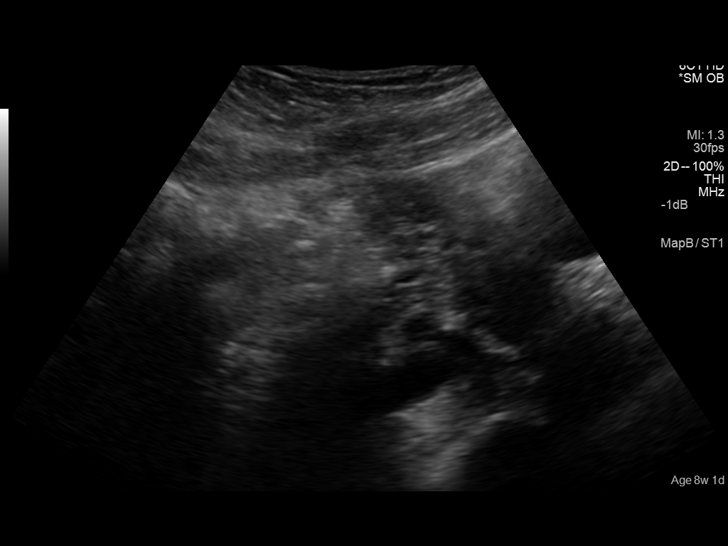
[im 8/64]
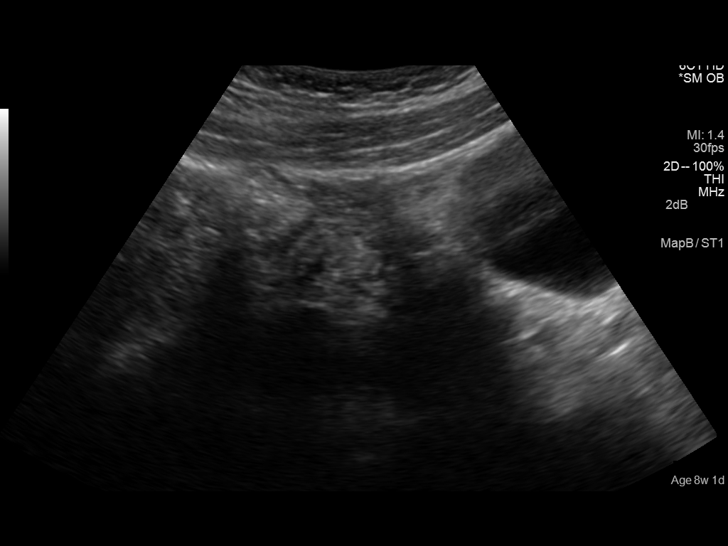
[im 12/64]
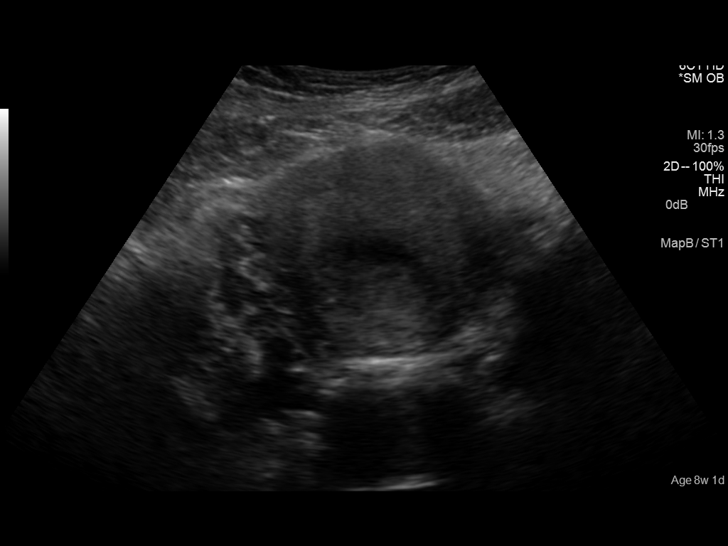
[im 17/64]
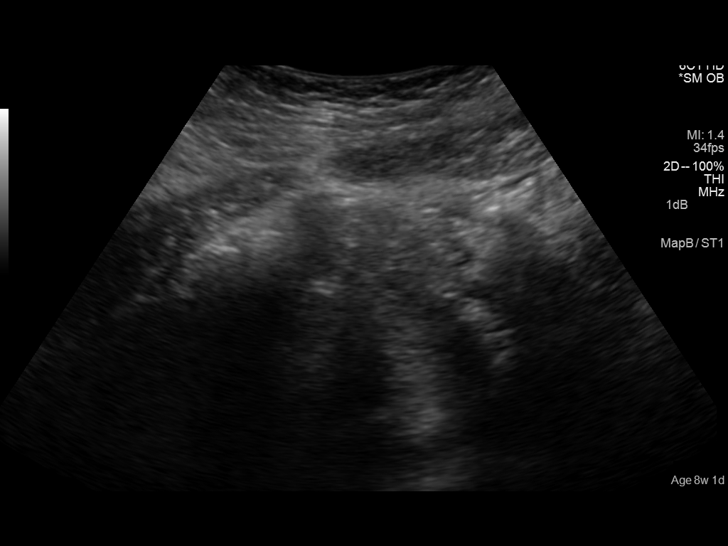
[im 22/64]
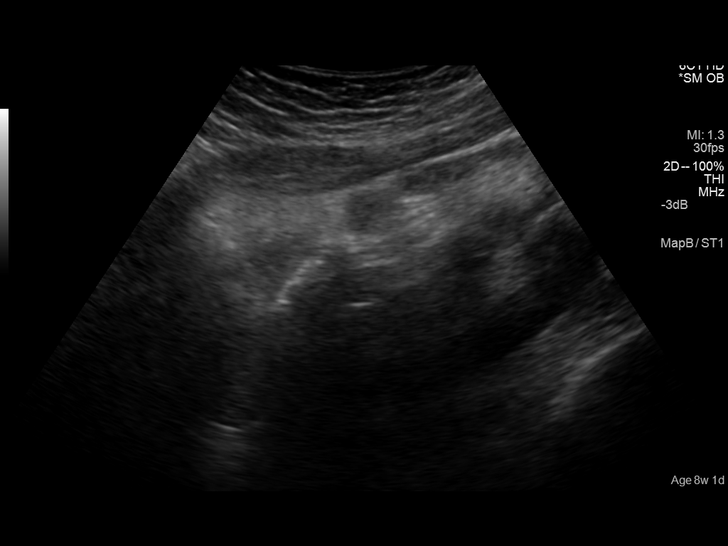
[im 26/64]
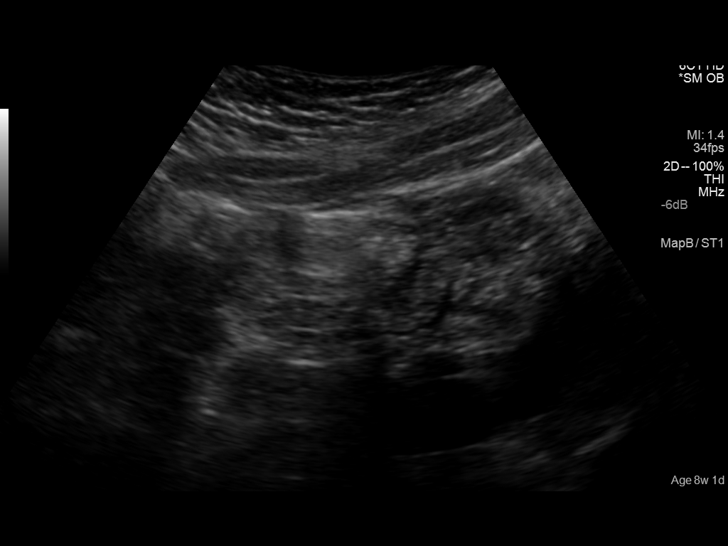
[im 31/64]
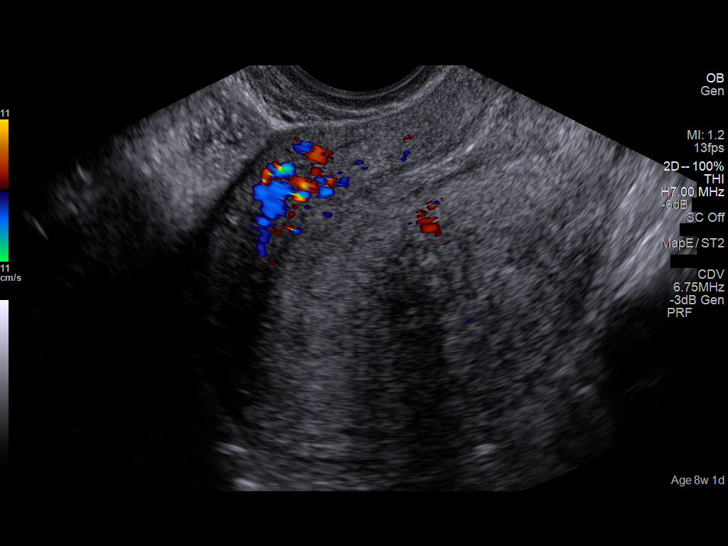
[im 36/64]
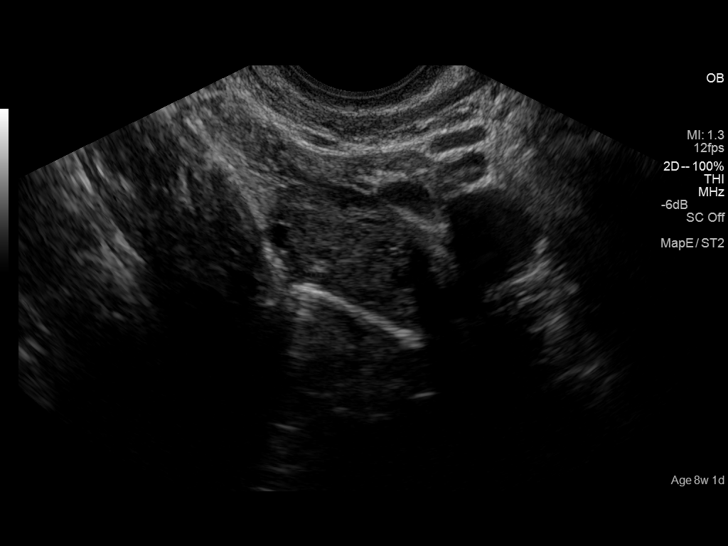
[im 40/64]
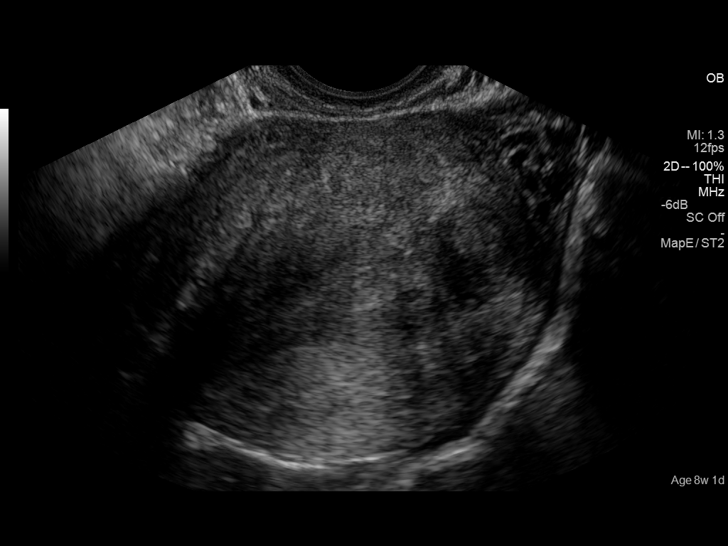
[im 45/64]
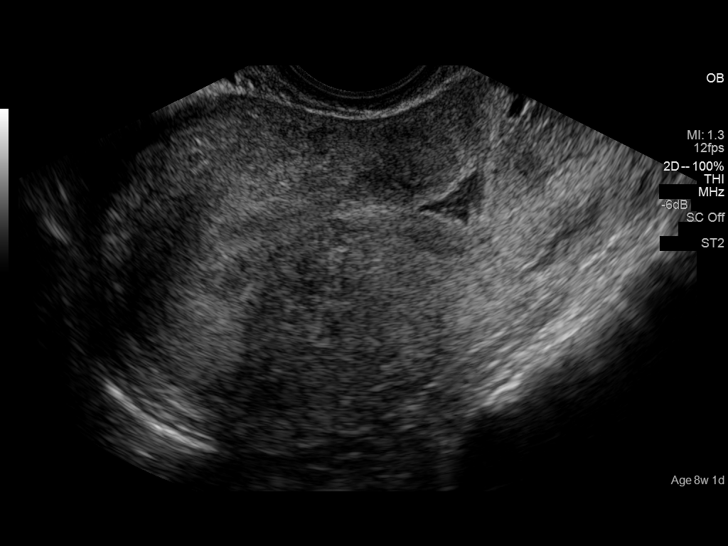
[im 50/64]
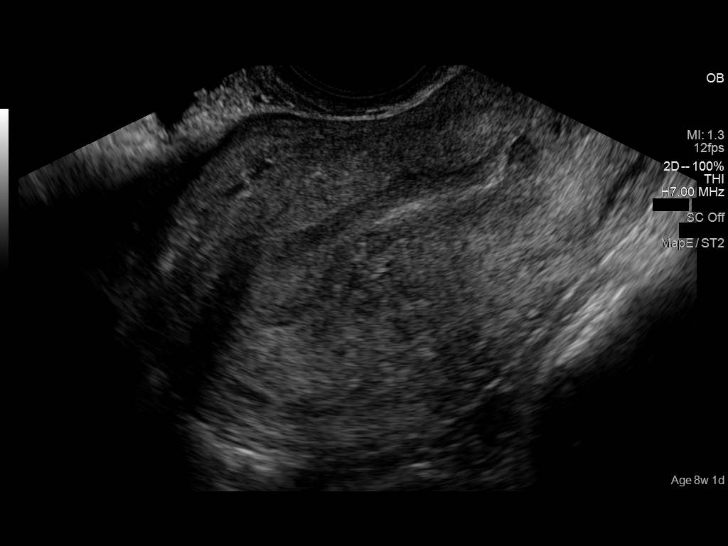
[im 54/64]
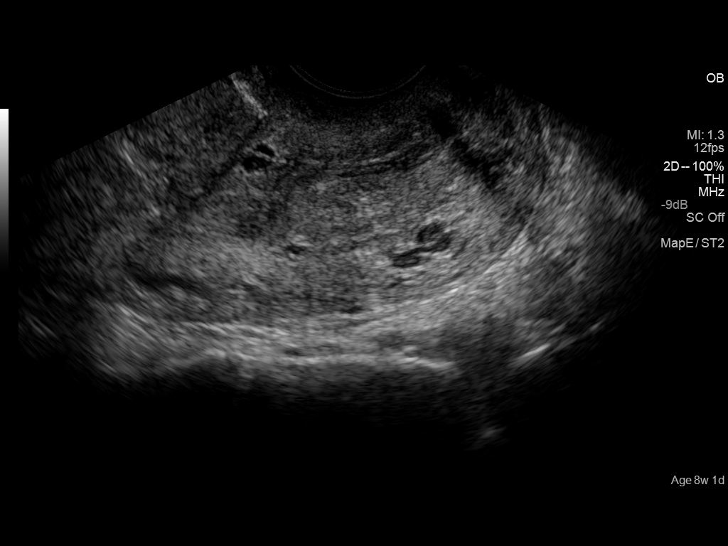
[im 59/64]
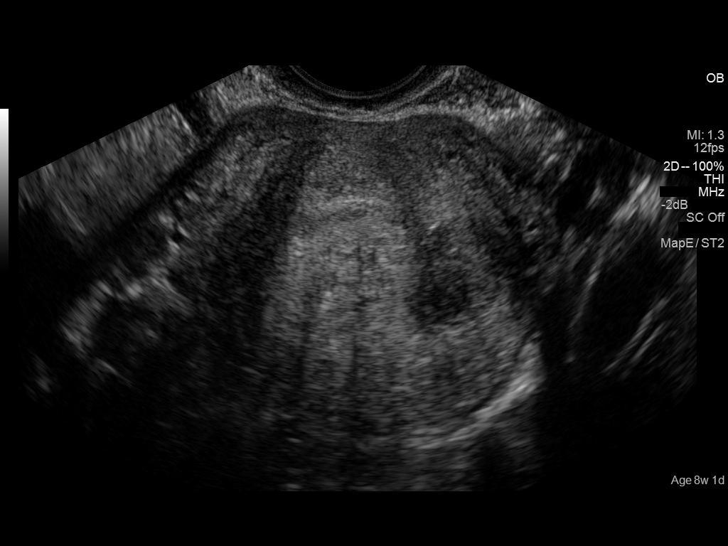
[im 64/64]
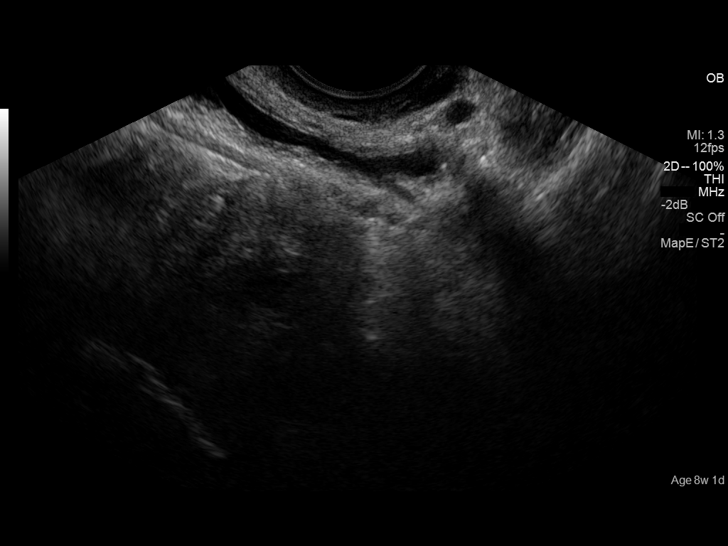

[14 of 28 positions shown; findings below may reference images not displayed]

FINDINGS: Intrauterine gestational sac: Not present

Yolk sac:  Not present

Embryo:  Not present

Cardiac Activity: Not present

Maternal uterus/adnexae: Echogenic avascular thickening of the
endometrium to 1.8 cm. No discrete gestational sac. 1.9 x 1.9 x
cm hypoechoic mildly shadowing posterior uterine intramural
leiomyoma. Normal appearance of the adnexae. No free fluid.
IMPRESSION: No sonographic findings of IUP or, ectopic pregnancy.

Echogenic debris within the endometrium most consistent with blood
products, less likely retained products of conception though,
recommend serial HCG to verify declination.

1.9 cm intramural leiomyoma.

## 2018-08-20 ENCOUNTER — Other Ambulatory Visit: Payer: Self-pay

## 2018-08-20 ENCOUNTER — Emergency Department
Admission: EM | Admit: 2018-08-20 | Discharge: 2018-08-20 | Disposition: A | Discharge status: Home / Self Care | Payer: Self-pay | Attending: Student in an Organized Health Care Education/Training Program | Admitting: Student in an Organized Health Care Education/Training Program

## 2018-08-20 ENCOUNTER — Encounter: Payer: Self-pay | Admitting: Emergency Medicine

## 2018-08-20 DIAGNOSIS — J069 Acute upper respiratory infection, unspecified: Secondary | ICD-10-CM | POA: Insufficient documentation

## 2018-08-20 DIAGNOSIS — F17200 Nicotine dependence, unspecified, uncomplicated: Secondary | ICD-10-CM | POA: Insufficient documentation

## 2018-08-20 DIAGNOSIS — B9789 Other viral agents as the cause of diseases classified elsewhere: Secondary | ICD-10-CM | POA: Insufficient documentation

## 2018-08-20 MED ORDER — PSEUDOEPH-BROMPHEN-DM 30-2-10 MG/5ML PO SYRP: 5.0000 mL | ORAL_SOLUTION | Freq: Four times a day (QID) | ORAL | 0 refills | Status: AC | PRN

## 2018-08-20 MED ORDER — METHYLPREDNISOLONE 4 MG PO TBPK: ORAL_TABLET | ORAL | 0 refills | Status: AC

## 2018-08-20 MED ORDER — IBUPROFEN 600 MG PO TABS: 600.0000 mg | ORAL_TABLET | Freq: Three times a day (TID) | ORAL | 0 refills | Status: AC | PRN

## 2018-08-20 NOTE — ED Triage Notes (Signed)
Cough and congestion. Started Monday with nasal drainage down back of throat .  Now it is down in chest.  Feels like she is wheezing.

## 2018-08-20 NOTE — ED Provider Notes (Signed)
[facility] Emergency Department Provider Note   ____________________________________________   First MD Initiated Contact with Patient 08/20/18 1241     (approximate)  I have reviewed the triage vital signs and the nursing notes.   HISTORY  Chief Complaint Cough and Nasal Congestion    HPI Denise Decker is a 38 y.o. female   patient complain of cough and congestion for 3 days.  She states she feels a postnasal drainage down her throat.  Patient states she has cough with developed into wheezing.  Patient unsure of fever.  Patient denies nausea, vomiting, diarrhea.  Patient had taken flu shot for this season.   History reviewed. No pertinent past medical history.  There are no active problems to display for this patient.   Past Surgical History:  Procedure Laterality Date  . CESAREAN SECTION      Prior to Admission medications   Medication Sig Start Date End Date Taking? Authorizing Provider  brompheniramine-pseudoephedrine-DM 30-2-10 MG/5ML syrup Take 5 mLs by mouth 4 (four) times daily as needed. 08/20/18   Joni Reining, PA-C  ibuprofen (ADVIL,MOTRIN) 600 MG tablet Take 1 tablet (600 mg total) by mouth every 8 (eight) hours as needed. 08/20/18   Joni Reining, PA-C  methylPREDNISolone (MEDROL DOSEPAK) 4 MG TBPK tablet Take Tapered dose as directed 08/20/18   Joni Reining, PA-C    Allergies Patient has no known allergies.  No family history on file.  Social History Social History   Tobacco Use  . Smoking status: Current Every Day Smoker  . Smokeless tobacco: Never Used  Substance Use Topics  . Alcohol use: No  . Drug use: No    Review of Systems  Constitutional: Fever/chills.   Eyes: No visual changes. ENT: Nasal congestion. Cardiovascular: Denies chest pain. Respiratory: Denies shortness of breath.  Nonproductive cough and wheezing. Gastrointestinal: No abdominal pain.  No nausea, no vomiting.  No diarrhea.  No  constipation. Genitourinary: Negative for dysuria. Musculoskeletal: Negative for back pain. Skin: Negative for rash. Neurological: Negative for headaches, focal weakness or numbness.   ____________________________________________   PHYSICAL EXAM:  VITAL SIGNS: ED Triage Vitals  Enc Vitals Group     BP 08/20/18 1233 121/79     Pulse Rate 08/20/18 1233 (!) 116     Resp 08/20/18 1233 16     Temp 08/20/18 1233 99.3 F (37.4 C)     Temp Source 08/20/18 1233 Oral     SpO2 08/20/18 1233 99 %     Weight 08/20/18 1234 145 lb (65.8 kg)     Height 08/20/18 1234 5\' 2"  (1.575 m)     Head Circumference --      Peak Flow --      Pain Score 08/20/18 1233 5     Pain Loc --      Pain Edu? --      Excl. in GC? --    Constitutional: Alert and oriented. Well appearing and in no acute distress. Eyes: Conjunctivae are normal. PERRL. EOMI. Head: Atraumatic. Nose: Edematous nasal turbinates clear rhinorrhea.  Mouth/Throat: Mucous membranes are moist.  Oropharynx non-erythematous.  Postnasal drainage. Neck: No stridor.  Hematological/Lymphatic/Immunilogical: No cervical lymphadenopathy. Cardiovascular: Normal rate, regular rhythm. Grossly normal heart sounds.  Good peripheral circulation. Respiratory: Normal respiratory effort.  No retractions. Lungs CTAB. Gastrointestinal: Soft and nontender. No distention. No abdominal bruits. No CVA tenderness. Skin:  Skin is warm, dry and intact. No rash noted. Psychiatric: Mood and affect are normal. Speech  and behavior are normal.  ____________________________________________   LABS (all labs ordered are listed, but only abnormal results are displayed)  Labs Reviewed - No data to display ____________________________________________  EKG   ____________________________________________  RADIOLOGY  ED MD interpretation:    Official radiology report(s): No results  found.  ____________________________________________   PROCEDURES  Procedure(s) performed (including Critical Care):  Procedures   ____________________________________________   INITIAL IMPRESSION / ASSESSMENT AND PLAN / ED COURSE  As part of my medical decision making, I reviewed the following data within the electronic MEDICAL RECORD NUMBER     Patient presents with cough and congestion for 3 days.  Physical exam consistent with viral respiratory infection.  Patient given discharge care instruction advised take medication as directed.  Patient advised follow-up PCP.      ____________________________________________   FINAL CLINICAL IMPRESSION(S) / ED DIAGNOSES  Final diagnoses:  Viral URI with cough     ED Discharge Orders         Ordered    brompheniramine-pseudoephedrine-DM 30-2-10 MG/5ML syrup  4 times daily PRN     08/20/18 1350    ibuprofen (ADVIL,MOTRIN) 600 MG tablet  Every 8 hours PRN     08/20/18 1350    methylPREDNISolone (MEDROL DOSEPAK) 4 MG TBPK tablet     08/20/18 1350           Note:  This document was prepared using Dragon voice recognition software and may include unintentional dictation errors.    Joni Reining, PA-C 08/20/18 1356    Willy Eddy, MD 08/20/18 1520

## 2018-08-20 NOTE — ED Notes (Signed)
See triage note  Presents with cold sxs' for 1 week  Now feels like it is in her chest   Cough is occasional prod   Low grade fever on arrival

## 2018-09-06 ENCOUNTER — Encounter: Payer: Self-pay | Admitting: Emergency Medicine

## 2018-09-06 ENCOUNTER — Other Ambulatory Visit: Payer: Self-pay

## 2018-09-06 ENCOUNTER — Emergency Department
Admission: EM | Admit: 2018-09-06 | Discharge: 2018-09-06 | Disposition: A | Discharge status: Home / Self Care | Payer: Medicaid Other | Attending: Emergency Medicine | Admitting: Emergency Medicine

## 2018-09-06 DIAGNOSIS — R112 Nausea with vomiting, unspecified: Secondary | ICD-10-CM

## 2018-09-06 DIAGNOSIS — O99331 Smoking (tobacco) complicating pregnancy, first trimester: Secondary | ICD-10-CM | POA: Insufficient documentation

## 2018-09-06 DIAGNOSIS — F17210 Nicotine dependence, cigarettes, uncomplicated: Secondary | ICD-10-CM | POA: Insufficient documentation

## 2018-09-06 DIAGNOSIS — O219 Vomiting of pregnancy, unspecified: Secondary | ICD-10-CM | POA: Insufficient documentation

## 2018-09-06 DIAGNOSIS — Z3A01 Less than 8 weeks gestation of pregnancy: Secondary | ICD-10-CM | POA: Insufficient documentation

## 2018-09-06 LAB — COMPREHENSIVE METABOLIC PANEL
ALK PHOS: 34 U/L — AB (ref 38–126)
ALT: 15 U/L (ref 0–44)
ANION GAP: 9 (ref 5–15)
AST: 20 U/L (ref 15–41)
Albumin: 4.1 g/dL (ref 3.5–5.0)
BUN: 6 mg/dL (ref 6–20)
CALCIUM: 9.3 mg/dL (ref 8.9–10.3)
CO2: 22 mmol/L (ref 22–32)
CREATININE: 0.76 mg/dL (ref 0.44–1.00)
Chloride: 105 mmol/L (ref 98–111)
GFR calc Af Amer: >60 mL/min (ref >60)
Glucose, Bld: 111 mg/dL — ABNORMAL HIGH (ref 70–99)
Potassium: 3.1 mmol/L — ABNORMAL LOW (ref 3.5–5.1)
SODIUM: 136 mmol/L (ref 135–145)
Total Bilirubin: 0.9 mg/dL (ref 0.3–1.2)
Total Protein: 7.9 g/dL (ref 6.5–8.1)

## 2018-09-06 LAB — CBC
HCT: 34.3 % — ABNORMAL LOW (ref 36.0–46.0)
Hemoglobin: 11.6 g/dL — ABNORMAL LOW (ref 12.0–15.0)
MCH: 33.5 pg (ref 26.0–34.0)
MCHC: 33.8 g/dL (ref 30.0–36.0)
MCV: 99.1 fL (ref 80.0–100.0)
PLATELETS: 237 10*3/uL (ref 150–400)
RBC: 3.46 MIL/uL — AB (ref 3.87–5.11)
RDW: 12.9 % (ref 11.5–15.5)
WBC: 11 10*3/uL — AB (ref 4.0–10.5)
nRBC: 0 % (ref 0.0–0.2)

## 2018-09-06 LAB — URINALYSIS, COMPLETE (UACMP) WITH MICROSCOPIC
BILIRUBIN URINE: NEGATIVE
GLUCOSE, UA: NEGATIVE mg/dL
KETONES UR: 80 mg/dL — AB
LEUKOCYTE UA: NEGATIVE
NITRITE: NEGATIVE
PH: 6 (ref 5.0–8.0)
Protein, ur: 30 mg/dL — AB
SPECIFIC GRAVITY, URINE: 1.029 (ref 1.005–1.030)
Squamous Epithelial / LPF: >50 — ABNORMAL HIGH (ref 0–5)

## 2018-09-06 LAB — POCT PREGNANCY, URINE: Preg Test, Ur: POSITIVE — AB

## 2018-09-06 LAB — LIPASE, BLOOD: LIPASE: 22 U/L (ref 11–51)

## 2018-09-06 MED ORDER — SODIUM CHLORIDE 0.9 % IV BOLUS
1000.0000 mL | Freq: Once | INTRAVENOUS | Status: AC
  Administered 2018-09-06: 1000 mL via INTRAVENOUS

## 2018-09-06 MED ORDER — PROMETHAZINE HCL 25 MG/ML IJ SOLN
12.5000 mg | Freq: Once | INTRAMUSCULAR | Status: AC
  Administered 2018-09-06: 12.5 mg via INTRAVENOUS
  Filled 2018-09-06: qty 1

## 2018-09-06 MED ORDER — ONDANSETRON HCL 4 MG PO TABS: 4.0000 mg | ORAL_TABLET | Freq: Three times a day (TID) | ORAL | 0 refills | Status: AC | PRN

## 2018-09-06 NOTE — Discharge Instructions (Signed)
If you have severe abdominal pain and significant vaginal bleeding more than a pad an hour, or you feel worse in any way return to the emergency department.  Otherwise, please follow-up closely with your primary care doctor in the person who is managing your intervention for your pregnancy.  Medications as prescribed.

## 2018-09-06 NOTE — ED Provider Notes (Signed)
[facility] Emergency Department Provider Note  ____________________________________________   I have reviewed the triage vital signs and the nursing notes. Where available I have reviewed prior notes and, if possible and indicated, outside hospital notes.    HISTORY  Chief Complaint Emesis    HPI Denise Decker is a 38 y.o. female  Dates she is [redacted] weeks pregnant, she went to a clinic and was given medication to induce a TAB, she took the first medication yesterday, he was scheduled to take more today however the medication made her nauseated.  She has no abdominal pain.  She had a tiny amount of spotting.  She has had no diarrhea.  She states that her intention is to continue aborting this pregnancy but the vomiting is making it hard for take her medication.  She also states she needs a work note. Patient's blood type is O+.  She has no other alleviating or aggravating symptoms no melena no bleeding, trace amount of vaginal bleeding she does not want a pelvic exam and ultrasound at this time.  She denies any hematemesis.   History reviewed. No pertinent past medical history.  There are no active problems to display for this patient.   Past Surgical History:  Procedure Laterality Date  . CESAREAN SECTION      Prior to Admission medications   Medication Sig Start Date End Date Taking? Authorizing Provider  brompheniramine-pseudoephedrine-DM 30-2-10 MG/5ML syrup Take 5 mLs by mouth 4 (four) times daily as needed. 08/20/18   Joni Reining, PA-C  ibuprofen (ADVIL,MOTRIN) 600 MG tablet Take 1 tablet (600 mg total) by mouth every 8 (eight) hours as needed. 08/20/18   Joni Reining, PA-C  methylPREDNISolone (MEDROL DOSEPAK) 4 MG TBPK tablet Take Tapered dose as directed 08/20/18   Joni Reining, PA-C    Allergies Patient has no known allergies.  History reviewed. No pertinent family history.  Social History Social History   Tobacco Use  . Smoking  status: Current Every Day Smoker  . Smokeless tobacco: Never Used  Substance Use Topics  . Alcohol use: No  . Drug use: No    Review of Systems Constitutional: No fever/chills Eyes: No visual changes. ENT: No sore throat. No stiff neck no neck pain Cardiovascular: Denies chest pain. Respiratory: Denies shortness of breath. Gastrointestinal:   no vomiting.  No diarrhea.  No constipation. Genitourinary: Negative for dysuria. Musculoskeletal: Negative lower extremity swelling Skin: Negative for rash. Neurological: Negative for severe headaches, focal weakness or numbness.   ____________________________________________   PHYSICAL EXAM:  VITAL SIGNS: ED Triage Vitals [09/06/18 1559]  Enc Vitals Group     BP 122/71     Pulse Rate 81     Resp 18     Temp 98 F (36.7 C)     Temp Source Oral     SpO2 100 %     Weight 147 lb (66.7 kg)     Height 5\' 2"  (1.575 m)     Head Circumference      Peak Flow      Pain Score 0     Pain Loc      Pain Edu?      Excl. in GC?     Constitutional: Alert and oriented. Well appearing and in no acute distress. Eyes: Conjunctivae are normal Head: Atraumatic HEENT: No congestion/rhinnorhea. Mucous membranes are moist.  Oropharynx non-erythematous Neck:   Nontender with no meningismus, no masses, no stridor Cardiovascular: Normal rate, regular rhythm. Grossly  normal heart sounds.  Good peripheral circulation. Respiratory: Normal respiratory effort.  No retractions. Lungs CTAB. Abdominal: Soft and nontender. No distention. No guarding no rebound Back:  There is no focal tenderness or step off.  there is no midline tenderness there are no lesions noted. there is no CVA tenderness Declines Musculoskeletal: No lower extremity tenderness, no upper extremity tenderness. No joint effusions, no DVT signs strong distal pulses no edema Neurologic:  Normal speech and language. No gross focal neurologic deficits are appreciated.  Skin:  Skin is warm, dry  and intact. No rash noted. Psychiatric: Mood and affect are normal. Speech and behavior are normal.  ____________________________________________   LABS (all labs ordered are listed, but only abnormal results are displayed)  Labs Reviewed  COMPREHENSIVE METABOLIC PANEL - Abnormal; Notable for the following components:      Result Value   Potassium 3.1 (*)    Glucose, Bld 111 (*)    Alkaline Phosphatase 34 (*)    All other components within normal limits  CBC - Abnormal; Notable for the following components:   WBC 11.0 (*)    RBC 3.46 (*)    Hemoglobin 11.6 (*)    HCT 34.3 (*)    All other components within normal limits  URINALYSIS, COMPLETE (UACMP) WITH MICROSCOPIC - Abnormal; Notable for the following components:   Color, Urine AMBER (*)    APPearance CLOUDY (*)    Hgb urine dipstick MODERATE (*)    Ketones, ur 80 (*)    Protein, ur 30 (*)    Bacteria, UA RARE (*)    Squamous Epithelial / LPF >50 (*)    All other components within normal limits  POCT PREGNANCY, URINE - Abnormal; Notable for the following components:   Preg Test, Ur POSITIVE (*)    All other components within normal limits  LIPASE, BLOOD  POC URINE PREG, ED    Pertinent labs  results that were available during my care of the patient were reviewed by me and considered in my medical decision making (see chart for details). ____________________________________________  EKG  I personally interpreted any EKGs ordered by me or triage  ____________________________________________  RADIOLOGY  Pertinent labs & imaging results that were available during my care of the patient were reviewed by me and considered in my medical decision making (see chart for details). If possible, patient and/or family made aware of any abnormal findings.  No results found. ____________________________________________    PROCEDURES  Procedure(s) performed: None  Procedures  Critical Care performed:  None  ____________________________________________   INITIAL IMPRESSION / ASSESSMENT AND PLAN / ED COURSE  Pertinent labs & imaging results that were available during my care of the patient were reviewed by me and considered in my medical decision making (see chart for details).  With some vomiting associated with the medication and designed to induce miscarriage which is not unusual.  She declines ultrasound or pelvic exam at this time, she certainly does not any evidence of ruptured ectopic she has no abdominal tenderness to deep palpation.  She understands limitations that is placed upon this by declining these interventions.  Patient has been given fluid and antiemetics here she feels under percent better she would like to go home and take this medications now and she would like a work note.  Serial abdominal exams are completely benign.    ____________________________________________   FINAL CLINICAL IMPRESSION(S) / ED DIAGNOSES  Final diagnoses:  None      This chart was dictated using  voice recognition software.  Despite best efforts to proofread,  errors can occur which can change meaning.      Jeanmarie Plant, MD 09/06/18 [phone]

## 2018-09-06 NOTE — ED Triage Notes (Addendum)
Pt here for vomiting.  Has been vomiting since last night. Reports multiple episodes vomiting today. Is pregnant.  Took first pill for abortion yesterday and supposed to take 4 pills today but hasn't taken because vomiting and doesn't think will keep them down.  No pain. VSS. Ambulatory. NAD. Reports her temp at home was 95, temp here WNL

## 2019-03-02 ENCOUNTER — Other Ambulatory Visit: Payer: Self-pay

## 2019-03-02 DIAGNOSIS — Z20822 Contact with and (suspected) exposure to covid-19: Secondary | ICD-10-CM

## 2019-03-04 LAB — NOVEL CORONAVIRUS, NAA: SARS-CoV-2, NAA: NOT DETECTED

## 2019-04-01 ENCOUNTER — Other Ambulatory Visit: Payer: Self-pay

## 2019-04-01 DIAGNOSIS — Z20822 Contact with and (suspected) exposure to covid-19: Secondary | ICD-10-CM

## 2019-04-02 LAB — NOVEL CORONAVIRUS, NAA: SARS-CoV-2, NAA: NOT DETECTED

## 2019-09-30 ENCOUNTER — Other Ambulatory Visit: Payer: Self-pay

## 2019-09-30 ENCOUNTER — Ambulatory Visit: Payer: Medicaid Other | Attending: Internal Medicine

## 2019-09-30 DIAGNOSIS — Z23 Encounter for immunization: Secondary | ICD-10-CM

## 2019-09-30 NOTE — Progress Notes (Signed)
   Covid-19 Vaccination Clinic  Name:  Denise Decker    MRN: [id number] DOB: Sep 27, 1980  09/30/2019  Ms. Denise Decker was observed post Covid-19 immunization for 15 minutes without incident. She was provided with Vaccine Information Sheet and instruction to access the V-Safe system.   Ms. Denise Decker was instructed to call 911 with any severe reactions post vaccine: Marland Kitchen Difficulty breathing  . Swelling of face and throat  . A fast heartbeat  . A bad rash all over body  . Dizziness and weakness   Immunizations Administered    Name Date Dose VIS Date Route   Pfizer COVID-19 Vaccine 09/30/2019 10:36 AM 0.3 mL 06/04/2019 Intramuscular   Manufacturer: [employer], [employer]   Lot: [id number]   NDC: [address]-6

## 2019-10-26 ENCOUNTER — Ambulatory Visit: Payer: Medicaid Other | Attending: Internal Medicine

## 2019-10-26 DIAGNOSIS — Z23 Encounter for immunization: Secondary | ICD-10-CM

## 2019-10-26 NOTE — Progress Notes (Signed)
   Covid-19 Vaccination Clinic  Name:  Denise Decker    MRN: [id number] DOB: 1980/11/07  10/26/2019  Ms. Salmon was observed post Covid-19 immunization for 15 minutes without incident. She was provided with Vaccine Information Sheet and instruction to access the V-Safe system.   [name] was instructed to call 911 with any severe reactions post vaccine: Marland Kitchen Difficulty breathing  . Swelling of face and throat  . A fast heartbeat  . A bad rash all over body  . Dizziness and weakness   Immunizations Administered    Name Date Dose VIS Date Route   Pfizer COVID-19 Vaccine 10/26/2019 11:46 AM 0.3 mL 08/18/2018 Intramuscular   Manufacturer: [employer], [employer]   Lot: [id number]   NDC: [address]-5

## 2022-02-07 ENCOUNTER — Encounter: Payer: Self-pay | Admitting: Family Medicine

## 2022-03-29 ENCOUNTER — Other Ambulatory Visit: Payer: Self-pay

## 2022-03-29 DIAGNOSIS — Z1231 Encounter for screening mammogram for malignant neoplasm of breast: Secondary | ICD-10-CM

## 2022-04-02 ENCOUNTER — Ambulatory Visit: Payer: Medicaid Other | Attending: Hematology and Oncology | Admitting: Hematology and Oncology

## 2022-04-02 ENCOUNTER — Ambulatory Visit
Admission: RE | Admit: 2022-04-02 | Discharge: 2022-04-02 | Disposition: A | Discharge status: Home / Self Care | Payer: Medicaid Other | Source: Ambulatory Visit | Attending: Obstetrics and Gynecology | Admitting: Obstetrics and Gynecology

## 2022-04-02 VITALS — BP 104/63 | Wt 158.3 lb

## 2022-04-02 DIAGNOSIS — Z1231 Encounter for screening mammogram for malignant neoplasm of breast: Secondary | ICD-10-CM | POA: Insufficient documentation

## 2022-04-02 NOTE — Progress Notes (Signed)
Denise Decker is a 41 y.o. female who presents to [facility] clinic today with no complaints.    Pap Smear: Pap not smear completed today. Last Pap smear was 04/05/20 at [facility] clinic and was normal. Per patient has no history of an abnormal Pap smear. Last Pap smear result is not available in Epic.   Physical exam: Breasts Breasts symmetrical. No skin abnormalities bilateral breasts. No nipple retraction bilateral breasts. No nipple discharge bilateral breasts. No lymphadenopathy. No lumps palpated bilateral breasts.       Pelvic/Bimanual Pap is not indicated today    Smoking History: Patient has is a current smoker at 1/2 packs per day and was referred to quit line.    Patient Navigation: Patient education provided. Access to services provided for patient through [facility] program. No interpreter provided. No transportation provided   Colorectal Cancer Screening: Per patient has never had colonoscopy completed No complaints today.    Breast and Cervical Cancer Risk Assessment: Patient does not have family history of breast cancer, known genetic mutations, or radiation treatment to the chest before age 73. Patient does not have history of cervical dysplasia, immunocompromised, or DES exposure in-utero.  Risk Assessment   No risk assessment data     A: [facility] exam without pap smear No complaints with benign exam.   P: [facility]. Appointment scheduled 04/02/22.  Dayton Scrape A, NP 04/02/2022 2:21 PM

## 2023-07-17 ENCOUNTER — Emergency Department
Admission: EM | Admit: 2023-07-17 | Discharge: 2023-07-18 | Disposition: A | Discharge status: Home / Self Care | Payer: Medicaid Other | Attending: Emergency Medicine | Admitting: Emergency Medicine

## 2023-07-17 ENCOUNTER — Other Ambulatory Visit: Payer: Self-pay

## 2023-07-17 DIAGNOSIS — R112 Nausea with vomiting, unspecified: Secondary | ICD-10-CM | POA: Insufficient documentation

## 2023-07-17 MED ORDER — ONDANSETRON HCL 4 MG/2ML IJ SOLN
4.0000 mg | Freq: Once | INTRAMUSCULAR | Status: AC
  Administered 2023-07-17: 4 mg via INTRAVENOUS
  Filled 2023-07-17: qty 2

## 2023-07-17 MED ORDER — SODIUM CHLORIDE 0.9 % IV BOLUS (SEPSIS)
1000.0000 mL | Freq: Once | INTRAVENOUS | Status: AC
  Administered 2023-07-17: 1000 mL via INTRAVENOUS

## 2023-07-18 LAB — COMPREHENSIVE METABOLIC PANEL
ALT: 14 U/L (ref 0–44)
AST: 17 U/L (ref 15–41)
Albumin: 4 g/dL (ref 3.5–5.0)
Alkaline Phosphatase: 43 U/L (ref 38–126)
Anion gap: 10 (ref 5–15)
BUN: 9 mg/dL (ref 6–20)
CO2: 23 mmol/L (ref 22–32)
Calcium: 9 mg/dL (ref 8.9–10.3)
Chloride: 104 mmol/L (ref 98–111)
Creatinine, Ser: 0.75 mg/dL (ref 0.44–1.00)
GFR, Estimated: >60 mL/min (ref >60)
Glucose, Bld: 122 mg/dL — ABNORMAL HIGH (ref 70–99)
Potassium: 3.2 mmol/L — ABNORMAL LOW (ref 3.5–5.1)
Sodium: 137 mmol/L (ref 135–145)
Total Bilirubin: 0.7 mg/dL (ref 0.0–1.2)
Total Protein: 8.3 g/dL — ABNORMAL HIGH (ref 6.5–8.1)

## 2023-07-18 LAB — CBC WITH DIFFERENTIAL/PLATELET
Abs Immature Granulocytes: 0.02 10*3/uL (ref 0.00–0.07)
Basophils Absolute: 0 10*3/uL (ref 0.0–0.1)
Basophils Relative: 0 %
Eosinophils Absolute: 0 10*3/uL (ref 0.0–0.5)
Eosinophils Relative: 0 %
HCT: 36.4 % (ref 36.0–46.0)
Hemoglobin: 12.3 g/dL (ref 12.0–15.0)
Immature Granulocytes: 0 %
Lymphocytes Relative: 8 %
Lymphs Abs: 0.7 10*3/uL (ref 0.7–4.0)
MCH: 33.4 pg (ref 26.0–34.0)
MCHC: 33.8 g/dL (ref 30.0–36.0)
MCV: 98.9 fL (ref 80.0–100.0)
Monocytes Absolute: 0.3 10*3/uL (ref 0.1–1.0)
Monocytes Relative: 3 %
Neutro Abs: 7.3 10*3/uL (ref 1.7–7.7)
Neutrophils Relative %: 89 %
Platelets: 261 10*3/uL (ref 150–400)
RBC: 3.68 MIL/uL — ABNORMAL LOW (ref 3.87–5.11)
RDW: 13 % (ref 11.5–15.5)
Smear Review: NORMAL
WBC: 8.3 10*3/uL (ref 4.0–10.5)
nRBC: 0 % (ref 0.0–0.2)

## 2023-07-18 LAB — LIPASE, BLOOD: Lipase: 27 U/L (ref 11–51)

## 2023-07-18 LAB — HCG, QUANTITATIVE, PREGNANCY: hCG, Beta Chain, Quant, S: <1 m[IU]/mL (ref <5)

## 2023-07-18 MED ORDER — ONDANSETRON 4 MG PO TBDP: 4.0000 mg | ORAL_TABLET | Freq: Four times a day (QID) | ORAL | 0 refills | Status: AC | PRN

## 2023-07-18 NOTE — Discharge Instructions (Addendum)
I suspect you have a viral illness causing your symptoms.  You may alternate Tylenol 1000 mg every 6 hours as needed for pain, fever and Ibuprofen 800 mg every 6-8 hours as needed for pain, fever.  Please take Ibuprofen with food.  Do not take more than 4000 mg of Tylenol (acetaminophen) in a 24 hour period.  You may take Imodium over-the-counter if you develop diarrhea.  I recommend a bland diet for the next several days.

## 2023-07-18 NOTE — ED Provider Notes (Signed)
[name] Provider Note    Event Date/Time   First MD Initiated Contact with Denise Decker 07/17/23 2330     (approximate)   History   Emesis (Denise Decker C/O nausea and vomiting that began about 30 minutes ago)   HPI  Denise Decker is a 43 y.o. female with no significant past medical history who presents to the emergency department with nausea and vomiting.  Denise Decker is the mother of another Denise Decker and while in the treatment room with her daughter she began vomiting.  No diarrhea.  Denies chest pain, abdominal pain.  No fever, dysuria or hematuria, vaginal bleeding or discharge.  She has had previous C-section.   History provided by Denise Decker, family.    Past Medical History:  Diagnosis Date   Heart murmur     Past Surgical History:  Procedure Laterality Date   CESAREAN SECTION      MEDICATIONS:  Prior to Admission medications   Medication Sig Start Date End Date Taking? Authorizing Provider  brompheniramine-pseudoephedrine-DM 30-2-10 MG/5ML syrup Take 5 mLs by mouth 4 (four) times daily as needed. 08/20/18   Joni Reining, PA-C  ibuprofen (ADVIL,MOTRIN) 600 MG tablet Take 1 tablet (600 mg total) by mouth every 8 (eight) hours as needed. 08/20/18   Joni Reining, PA-C  methylPREDNISolone (MEDROL DOSEPAK) 4 MG TBPK tablet Take Tapered dose as directed 08/20/18   Joni Reining, PA-C  ondansetron (ZOFRAN) 4 MG tablet Take 1 tablet (4 mg total) by mouth every 8 (eight) hours as needed for nausea or vomiting. 09/06/18   Jeanmarie Plant, MD    Physical Exam   Triage Vital Signs: ED Triage Vitals  Encounter Vitals Group     BP 07/17/23 2359 111/77     Systolic BP Percentile --      Diastolic BP Percentile --      Pulse Rate 07/17/23 2359 68     Resp 07/17/23 2359 18     Temp 07/17/23 2359 98 F (36.7 C)     Temp Source 07/17/23 2359 Oral     SpO2 07/17/23 2359 98 %     Weight 07/17/23 2337 165 lb (74.8 kg)     Height 07/17/23 2337 5\' 2"  (1.575  m)     Head Circumference --      Peak Flow --      Pain Score --      Pain Loc --      Pain Education --      Exclude from Growth Chart --     Most recent vital signs: Vitals:   07/17/23 2359  BP: 111/77  Pulse: 68  Resp: 18  Temp: 98 F (36.7 C)  SpO2: 98%    CONSTITUTIONAL: Alert, responds appropriately to questions. Well-appearing; well-nourished HEAD: Normocephalic, atraumatic EYES: Conjunctivae clear, pupils appear equal, sclera nonicteric ENT: normal nose; moist mucous membranes NECK: Supple, normal ROM CARD: RRR; S1 and S2 appreciated RESP: Normal chest excursion without splinting or tachypnea; breath sounds clear and equal bilaterally; no wheezes, no rhonchi, no rales, no hypoxia or respiratory distress, speaking full sentences ABD/GI: Non-distended; soft, non-tender, no rebound, no guarding, no peritoneal signs, no tenderness at McBurney's point BACK: The back appears normal EXT: Normal ROM in all joints; no deformity noted, no edema SKIN: Normal color for age and race; warm; no rash on exposed skin NEURO: Moves all extremities equally, normal speech PSYCH: The Denise Decker's mood and manner are appropriate.   ED Results / Procedures /  Treatments   LABS: (all labs ordered are listed, but only abnormal results are displayed) Labs Reviewed  CBC WITH DIFFERENTIAL/PLATELET - Abnormal; Notable for the following components:      Result Value   RBC 3.68 (*)    All other components within normal limits  COMPREHENSIVE METABOLIC PANEL - Abnormal; Notable for the following components:   Potassium 3.2 (*)    Glucose, Bld 122 (*)    Total Protein 8.3 (*)    All other components within normal limits  LIPASE, BLOOD  HCG, QUANTITATIVE, PREGNANCY     EKG:  EKG Interpretation Date/Time:    Ventricular Rate:    PR Interval:    QRS Duration:    QT Interval:    QTC Calculation:   R Axis:      Text Interpretation:           RADIOLOGY: My personal review and  interpretation of imaging:    I have personally reviewed all radiology reports.   No results found.   PROCEDURES:  Critical Care performed: No      Procedures    IMPRESSION / MDM / ASSESSMENT AND PLAN / ED COURSE  I reviewed the triage vital signs and the nursing notes.    Denise Decker here with nausea, vomiting.    DIFFERENTIAL DIAGNOSIS (includes but not limited to):   Suspect viral gastroenteritis.  Doubt appendicitis, colitis, diverticulitis, bowel obstruction, cholecystitis, pancreatitis, kidney stone, pyelonephritis given benign exam.   Denise Decker's presentation is most consistent with acute complicated illness / injury requiring diagnostic workup.   PLAN: Will obtain labs and give IV fluids, Zofran for symptomatic relief.   MEDICATIONS GIVEN IN ED: Medications  sodium chloride 0.9 % bolus 1,000 mL (0 mLs Intravenous Stopped 07/18/23 0020)  ondansetron (ZOFRAN) injection 4 mg (4 mg Intravenous Given 07/17/23 2350)     ED COURSE: Labs show no leukocytosis, normal electrolytes, LFTs and lipase.  Pregnancy test negative.  Denise Decker reports feeling better and is tolerating p.o.  Suspect viral illness causing symptoms today as we are seeing large numbers of norovirus in her community.  Discussed supportive care instructions and return precautions.  Denise Decker verbalized understanding.   At this time, I do not feel there is any life-threatening condition present. I reviewed all nursing notes, vitals, pertinent previous records.  All lab and urine results, EKGs, imaging ordered have been independently reviewed and interpreted by myself.  I reviewed all available radiology reports from any imaging ordered this visit.  Based on my assessment, I feel the Denise Decker is safe to be discharged home without further emergent workup and can continue workup as an outpatient as needed. Discussed all findings, treatment plan as well as usual and customary return precautions.  They verbalize  understanding and are comfortable with this plan.  Outpatient follow-up has been provided as needed.  All questions have been answered.    CONSULTS:  none   OUTSIDE RECORDS REVIEWED: No previous records for review.       FINAL CLINICAL IMPRESSION(S) / ED DIAGNOSES   Final diagnoses:  Nausea and vomiting in adult     Rx / DC Orders   ED Discharge Orders          Ordered    ondansetron (ZOFRAN-ODT) 4 MG disintegrating tablet  Every 6 hours PRN        07/18/23 0209             Note:  This document was prepared using Dragon voice recognition software and may  include unintentional dictation errors.   Sparkles Mcneely, Layla Maw, DO 07/18/23 [phone]

## 2023-09-10 ENCOUNTER — Other Ambulatory Visit: Payer: Self-pay | Admitting: Nurse Practitioner

## 2023-09-10 DIAGNOSIS — Z1231 Encounter for screening mammogram for malignant neoplasm of breast: Secondary | ICD-10-CM

## 2023-10-15 ENCOUNTER — Ambulatory Visit
Admission: RE | Admit: 2023-10-15 | Discharge: 2023-10-15 | Disposition: A | Discharge status: Home / Self Care | Source: Ambulatory Visit | Attending: Nurse Practitioner | Admitting: Nurse Practitioner

## 2023-10-15 DIAGNOSIS — Z1231 Encounter for screening mammogram for malignant neoplasm of breast: Secondary | ICD-10-CM | POA: Insufficient documentation
